# Patient Record
Sex: Male | Born: 1960 | Race: White | Hispanic: No | Marital: Married
Health system: Midwestern US, Academic
[De-identification: ages and names within clinical notes are randomized; demographics above are authoritative.]

---

## 2020-06-21 ENCOUNTER — Encounter: Admit: 2020-06-21 | Discharge: 2020-06-21 | Payer: BC Managed Care – PPO

## 2020-06-24 ENCOUNTER — Encounter: Admit: 2020-06-24 | Discharge: 2020-06-24 | Payer: BC Managed Care – PPO

## 2020-06-24 ENCOUNTER — Ambulatory Visit: Admit: 2020-06-24 | Discharge: 2020-06-24 | Payer: BC Managed Care – PPO

## 2020-06-24 ENCOUNTER — Ambulatory Visit: Admit: 2020-06-24 | Discharge: 2020-06-25 | Payer: BC Managed Care – PPO

## 2020-06-24 DIAGNOSIS — Z20822 Encounter for screening laboratory testing for COVID-19 virus in asymptomatic patient: Secondary | ICD-10-CM

## 2020-06-24 DIAGNOSIS — N200 Calculus of kidney: Secondary | ICD-10-CM

## 2020-06-24 DIAGNOSIS — A6920 Lyme disease, unspecified: Secondary | ICD-10-CM

## 2020-06-24 DIAGNOSIS — F419 Anxiety disorder, unspecified: Secondary | ICD-10-CM

## 2020-06-24 LAB — BASIC METABOLIC PANEL
Lab: 1.1 mg/dL (ref 0.4–1.24)
Lab: 10 g/dL (ref 3–12)
Lab: 106 MMOL/L (ref 98–110)
Lab: 14 mg/dL (ref 7–25)
Lab: 140 MMOL/L (ref 137–147)
Lab: 24 MMOL/L (ref 21–30)
Lab: 4.2 MMOL/L (ref 3.5–5.1)
Lab: 84 mg/dL (ref 70–100)
Lab: 9.2 mg/dL (ref 8.5–10.6)
Lab: >60 mL/min (ref >60)
Lab: >60 mL/min (ref >60)

## 2020-06-24 LAB — CBC
Lab: 4.6 M/UL (ref 4.4–5.5)
Lab: 9.4 10*3/uL (ref 4.5–11.0)

## 2020-06-24 NOTE — Patient Instructions
Urology: [facility]   Pre-Operative Instructions    Surgical Procedure:  Left Ureteroscopy  Date of Surgery:  07/30/20   Arrival Time at the Admission Office (Main Lobby):  TBD    To ensure that your surgery can proceed without delay, you will be contacted by a phone triage nurse from the Preoperative Assessment [facility]) to complete this process.  Please review the information given to you by your surgeon.    You will be called by the surgery staff with your day of surgery arrival time between 2:30 - 4:30 PM the business day prior to surgery. If you have not heard from them after 4:30 PM, please call [phone] to confirm your arrival time.    Pre-Operative Assessment and Instructions:    Once you speak to the nurse or are seen in the Pre-Operative Assessment Clinic, you will be given medication instructions. However, if surgery is within 2 weeks, please read and follow the medication instructions below to prepare for surgery before you speak with the phone triage nurse:    DO NOT STOP your blood thinner until you have contacted your prescribing provider or have been specifically instructed to do so.    Starting Now:  ? Contact your provider who prescribes any of the following to develop a plan for surgery:  o Blood thinners such as aspirin, Aggrenox, Brilinta, Effient, Eliquis, enoxaparin (Lovenox), clopidogrel (Plavix), cilostazol, pentoxifylline (Trental), Pradaxa, Savaysa, ticlopidine, Xarelto, and warfarin (Coumadin)  o Immunosuppresants such as methotrexate, azathioprine, sulfasalazine, everolimus, sirolimus, Humira, Remicade, Enbrel, Simponi, Orencia, Cimzia, Actemra, and Xeljanz  o Chemotherapy    14 days prior to surgery:  ? Stop most vitamins, herbals, and supplements including (but not limited to):  o Alpha lipoic acid, black cohosh, CoQ10, echinacea, eye vitamins, fish oil, flaxseed oil, garlic, gingko biloba, ginseng, glucosamine/chondroitin, kava, Lovaza, lutein, lysine, multivitamin, red yeast rice, SAM-e, saw palmetto, St. John?s wort, turmeric, valerian root, Vascepa, Vitamin A, Vitamin B complex, Vitamin C, Vitamin E  - You DO NOT need to stop: iron, magnesium, potassium    7 days prior to surgery:  ? Stop anti-inflammatory medications such as ibuprofen (Advil, Motrin), naproxen (Aleve), Alka-Seltzer, Excedrin, Midol, celecoxib (Celebrex), diclofenac (Voltaren), diflunisal, etodolac, flurbiprofen, indomethacin, ketoprofen, ketorolac, meloxicam, nabumetone, and piroxicam    ========================================================================    Do not drink alcohol within 24 hours of surgery.    Please do not eat or drink anything after midnight.  No gum, mints, hard candy, snacks, coffee, etc or chewing tobacco allowed after midnight before surgery.  You may brush your teeth but be sure to rinse and spit.    Please shower with an over the counter antibacterial soap the evening before or the morning of surgery.    If your surgery is scheduled as an outpatient, you must arrange to have someone drive you home and have someone with you 24 hours after anesthesia.    If you stay in the hospital after surgery, you will be evaluated daily by your care team for appropriateness for discharge. You will be responsible for coordinating your transportation and home support for your recovery. If you do not know the needs for post-surgical support, please address this prior to surgery with your surgeon or your surgeon's nurse so you will have a safe transition home.     ========================================================================    If you have any questions, please contact your provider's office at [phone].    For emergencies during evenings, nights, weekends, and holidays, contact [facility] operator and request they contact the on-call Urology Resident at [phone].    =========================================================================    PLEASE STOP BY OUTPATIENT LAB FOR PREOPERATIVE LABS TODAY

## 2020-06-24 NOTE — Assessment & Plan Note
It was a pleasure seeing Nathaniel May in clinic today for evaluation of a LEFT 4mm UVJ stone. These findings were discussed in detail with the patient along with treatment options including observation, ESWL, Ureteroscopy, and PCNL. Risks and benefits of each described in detail. Based on today's visit, we will plan for cystoscopy, LEFT URS. Additionally, given family history, described gross hematuria, and renal cysts incompletely evaluated on non-con CT will obtain a CT urogram. This study will also allow [place] to assess for stone passage.     If patient passes stone in the interim and elects to not undergo L URS for nonobstructive stone, he will require a in office cystoscopy for completion of hematuria workup     - Continue flomax  - Plan for cystoscopy, L URS/LL on 07/30/20  - BMP, CBC, urine culture today   - CT urogram for hematuria/cyst workup 07/28/20  - Recommend dietary modifications  - Given strict instructions on when to return to ED if signs of infection such as fever and chills for emergency stent placement if needed    Jane Canary, MD   Urology Resident

## 2020-06-25 ENCOUNTER — Ambulatory Visit: Admit: 2020-06-25 | Discharge: 2020-06-25 | Payer: BC Managed Care – PPO

## 2020-06-25 DIAGNOSIS — N200 Calculus of kidney: Secondary | ICD-10-CM

## 2020-06-25 DIAGNOSIS — R310 Gross hematuria: Secondary | ICD-10-CM

## 2020-06-29 ENCOUNTER — Encounter: Admit: 2020-06-29 | Discharge: 2020-06-29 | Payer: BC Managed Care – PPO

## 2020-06-29 NOTE — Telephone Encounter
Per Dr. Encarnacion Chu note, pt needs CTU on 12/7.  Appt scheduled, called and left VM for pt with date/time/location of CT appt.

## 2020-07-01 ENCOUNTER — Encounter: Admit: 2020-07-01 | Discharge: 2020-07-01 | Payer: BC Managed Care – PPO

## 2020-07-01 MED ORDER — LEVOFLOXACIN 500 MG PO TAB
500 mg | ORAL_TABLET | Freq: Every day | ORAL | 0 refills | 7.00000 days | Status: AC
Start: 2020-07-01 — End: ?

## 2020-07-01 NOTE — Telephone Encounter
Called patient to let him know that his urine culture results came back positive. Let patient know I have sent 5 days of levaquin to his pharmacy to take now and another 5 day course for him to take starting 07/25/20. Patient verbalized understanding.

## 2020-07-02 ENCOUNTER — Encounter: Admit: 2020-07-02 | Discharge: 2020-07-02 | Payer: BC Managed Care – PPO

## 2020-07-02 NOTE — Telephone Encounter
Received return call from patient requesting return call from Dr. Encarnacion Chu nurse with additional questions regarding antibiotic ordered yesterday. Returned call and spoke to patient and wife. Patient inquired if infection was a UTI or in his blood stream. Advised patient his urine culture grew out a bacteria that needs treated prior to surgery per Dr. Luther Redo. Advised patient to take first prescription of Levaquin 500mg  daily for 5 days then additional Levaquin 500mg  daily starting on 07/25/20. Patient stated he is having some back pain. Advised it is possibly related to current infection. Advised patient to call back after completed first round of infection if he is not feeling better to check in with Dr. team and plan on taking second antibiotic as directed 07/25/20. Patient and wife verbalized understanding and voiced appreciation for call. No further questions.

## 2020-07-28 ENCOUNTER — Encounter: Admit: 2020-07-28 | Discharge: 2020-07-28 | Payer: BC Managed Care – PPO

## 2020-07-28 ENCOUNTER — Ambulatory Visit: Admit: 2020-07-28 | Discharge: 2020-07-28 | Payer: BC Managed Care – PPO

## 2020-07-28 DIAGNOSIS — R310 Gross hematuria: Secondary | ICD-10-CM

## 2020-07-28 MED ORDER — IOHEXOL 350 MG IODINE/ML IV SOLN
100 mL | Freq: Once | INTRAVENOUS | 0 refills | Status: CP
Start: 2020-07-28 — End: ?
  Administered 2020-07-28: 21:00:00 100 mL via INTRAVENOUS

## 2020-07-28 MED ORDER — SODIUM CHLORIDE 0.9 % IJ SOLN
50 mL | Freq: Once | INTRAVENOUS | 0 refills | Status: CP
Start: 2020-07-28 — End: ?
  Administered 2020-07-28: 21:00:00 50 mL via INTRAVENOUS

## 2020-07-29 ENCOUNTER — Encounter: Admit: 2020-07-29 | Discharge: 2020-07-29 | Payer: BC Managed Care – PPO

## 2020-07-29 NOTE — Telephone Encounter
Per Dr. Luther Redo, he called and spoke to pt regarding CT results from August 12, 2023.  Pt has passed 10mm stone, surgery tomorrow is not indicated at this time and will be cancelled.  Pt will f/u with Dellia Beckwith, NP in 9 months with renal U/S prior.

## 2020-07-30 ENCOUNTER — Encounter: Admit: 2020-07-30 | Discharge: 2020-07-30 | Payer: BC Managed Care – PPO

## 2020-07-30 DIAGNOSIS — N200 Calculus of kidney: Secondary | ICD-10-CM

## 2020-07-30 NOTE — Telephone Encounter
Pt calling about renal cysts noted on 12/07 CT scan. Reviewed they are noted to be stable and un-chanced from previous imaging. No further questions.

## 2021-09-20 ENCOUNTER — Encounter: Admit: 2021-09-20 | Discharge: 2021-09-20 | Payer: BC Managed Care – PPO

## 2021-09-24 ENCOUNTER — Encounter: Admit: 2021-09-24 | Discharge: 2021-09-24 | Payer: BC Managed Care – PPO

## 2021-09-24 NOTE — Telephone Encounter
09/24/21 - STAT record request faxed to the following per [facility] task request:    Please request additional records from PCP, [facility] - Ph: [phone]; Fax: [phone]. No previous cardiac care.    JMR

## 2021-10-07 ENCOUNTER — Encounter: Admit: 2021-10-07 | Discharge: 2021-10-07 | Payer: BC Managed Care – PPO

## 2021-10-07 ENCOUNTER — Ambulatory Visit: Admit: 2021-10-07 | Discharge: 2021-10-08 | Payer: BC Managed Care – PPO

## 2021-10-07 DIAGNOSIS — E785 Hyperlipidemia, unspecified: Secondary | ICD-10-CM

## 2021-10-07 DIAGNOSIS — A6920 Lyme disease, unspecified: Secondary | ICD-10-CM

## 2021-10-07 DIAGNOSIS — K7581 Nonalcoholic steatohepatitis (NASH): Secondary | ICD-10-CM

## 2021-10-07 DIAGNOSIS — Z9189 Other specified personal risk factors, not elsewhere classified: Secondary | ICD-10-CM

## 2021-10-07 DIAGNOSIS — F419 Anxiety disorder, unspecified: Secondary | ICD-10-CM

## 2021-10-07 DIAGNOSIS — G4730 Sleep apnea, unspecified: Secondary | ICD-10-CM

## 2021-10-07 DIAGNOSIS — F5104 Psychophysiologic insomnia: Secondary | ICD-10-CM

## 2021-10-07 DIAGNOSIS — G4733 Obstructive sleep apnea (adult) (pediatric): Secondary | ICD-10-CM

## 2021-10-07 NOTE — Progress Notes
Date of Service: 10/07/2021    Nathaniel May is a 61 y.o. male.       HPI     Patient is a 61 year old Caucasian male past medical history of obstructive sleep apnea on therapy,  Steatohepatitis(multifactorial), hyperlipidemia, history of chronic Lyme disease who struggles with general muscle aches and discomfort as well as chronic diarrhea.  He does not have significant chest pain, palpitation, or activity intolerance.  Reports chronic fatigue and is trying to develop more regular exercise activities to combat the symptoms.  Has been evaluated by his physician assistant over the last couple years including a CAC score that was increased 40-50 from 1 year to the next.  By best risk estimate this and put him at the 50th percentile for other men his age.  His ASCVD risk estimator puts him at about 11.8% over 10-year risk.  This would be intermediate risk.  Reports previous attempts to take statin therapies have been ineffective because of side effects.  Currently started rosuvastatin and has tolerated that fairly well.  Is on WelChol for his chronic diarrhea with improvement.  Also takes chronic antibiotic therapy because of Lyme disease.         Vitals:    10/07/21 1021   BP: 130/72   BP Source: Arm, Left Upper   Pulse: 59   SpO2: 98%   O2 Device: None (Room air)   PainSc: Zero   Weight: 124.9 kg (275 lb 6.4 oz)   Height: 193 cm (6' 4)     Body mass index is 33.52 kg/m?Marland Kitchen     Past Medical History  Patient Active Problem List    Diagnosis Date Noted   ? Hyperlipemia 10/07/2021   ? OSA (obstructive sleep apnea) 10/07/2021   ? NASH (nonalcoholic steatohepatitis) 10/07/2021   ? Chronic insomnia 10/07/2021   ? Elevated coronary artery calcium score 10/07/2021     09/13/21 Coronary Calcium Score:  Total 48, Left Main 20, LAD 28     ? Nephrolithiasis 06/24/2020   ? Gross hematuria 06/24/2020         Review of Systems   Constitutional: Negative.   HENT: Negative.    Eyes: Negative.    Cardiovascular: Positive for chest pain. Respiratory: Negative.    Endocrine: Negative.    Hematologic/Lymphatic: Negative.    Skin: Negative.    Musculoskeletal: Negative.    Gastrointestinal: Negative.    Genitourinary: Negative.    Neurological: Negative.    Psychiatric/Behavioral: Negative.    Allergic/Immunologic: Negative.        Physical Exam  Well-nourished, male that appears his given age in no distress  Pupils are equal react without scleral injection  Neck is supple with normal Cropp stroke and no bruits.  No masses or jugular venous abnormalities  Lungs are clear to auscultation and symmetric findings  Heart S1, S2 that are normal.  No murmurs, clicks, or gallops  Abdomen is soft and nontender  Pulse are 2+, regular, and symmetric at radial as well as pedal locations  No significant peripheral edema or obvious skin abnormalities    Cardiovascular Studies  Reviewed ECG which is normal, as well as findings from CAC score and other blood work.    Cardiovascular Health Factors  Vitals BP Readings from Last 3 Encounters:   10/07/21 130/72   06/24/20 133/79     Wt Readings from Last 3 Encounters:   10/07/21 124.9 kg (275 lb 6.4 oz)   06/24/20 121.6 kg (268 lb)  BMI Readings from Last 3 Encounters:   10/07/21 33.52 kg/m?   06/24/20 31.78 kg/m?      Smoking Social History     Tobacco Use   Smoking Status Never   Smokeless Tobacco Former      Lipid Profile No results found for: CHOL  No results found for: HDL  No results found for: LDL  No results found for: TRIG   Blood Sugar No results found for: HGBA1C  Glucose   Date Value Ref Range Status   06/24/2020 84 70 - 100 MG/DL Final          Problems Addressed Today  Encounter Diagnoses   Name Primary?   ? Candidate for statin therapy due to risk of future cardiovascular event Yes       Assessment and Plan     The ASCVD risk estimator as well as a coronary artery calcium score are about determining risk for cardiovascular events or complications.  A coronary artery calcium score is not used to determine unstable findings, and therefore would not justify a cardiac stress test.  He does not have symptoms that are consistent with unstable cardiovascular disease.  Therefore would not advise stress testing at this time.  Because he is intermediate risk for cardiovascular disease statin therapy is warranted.  Currently doing fine on rosuvastatin 10 mg p.o. daily.  Would continue that therapy.  More and more data has been accumulated with studies that shows there is no benefit of fibrate medications treating atherosclerotic disease.  Therefore he can stop the fenofibrate.  Did discuss better exercise and diet habits that would help him get into better metabolic state.  Goal would be to move his weight to about 250 pounds.  Patient reports understanding.  No further testing at this time.  Routine follow-up can be if needed.         Current Medications (including today's revisions)  ? azithromycin (ZITHROMAX) 500 mg tablet Take one tablet by mouth daily.   ? citalopram (CELEXA) 20 mg tablet    ? colesevelam (WELCHOL) 625 mg tablet Take have a tab daily   ? eszopiclone (LUNESTA) 2 mg tablet    ? fenofibrate nanocrystallized (TRICOR) 145 mg tablet Take one tablet by mouth daily.   ? rosuvastatin (CRESTOR) 10 mg tablet    ? zolpidem (AMBIEN) 10 mg tablet Take one tablet by mouth at bedtime as needed for Sleep.

## 2023-08-02 IMAGING — CT CALCIUM_SCORING(Adult)
2 series · 16 of 20 positions shown, 18 images · non-contrast
Comparison: none

[Series 2: calcium scoring 3.00 qr36 bestdiast 65% · axial · 0.44mm/px · z∈[+1641,+1777]mm · 8 of 119 slices shown, 10 images]
[im 14/119  vessel]
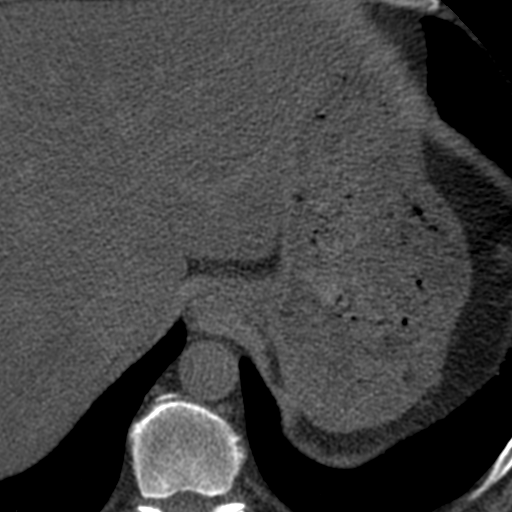
[im 14/119  lung]
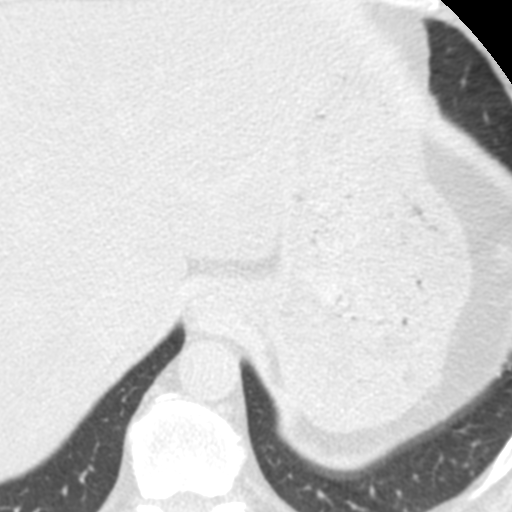
[im 27/119  vessel]
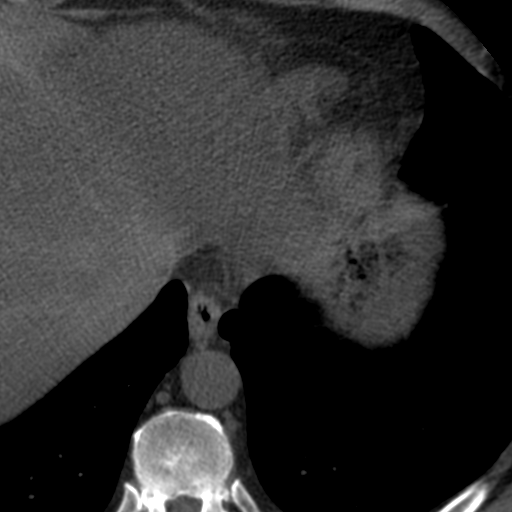
[im 40/119  vessel]
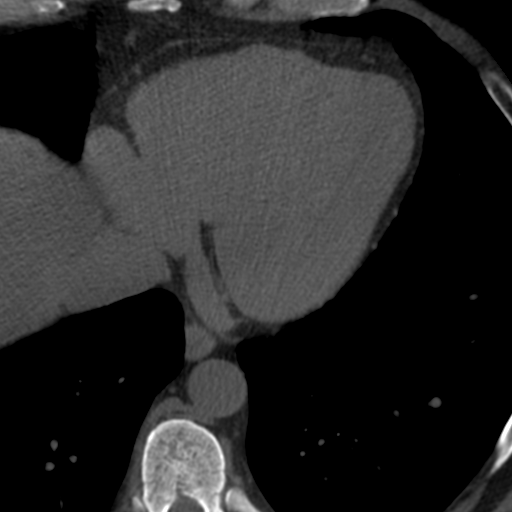
[im 53/119  vessel]
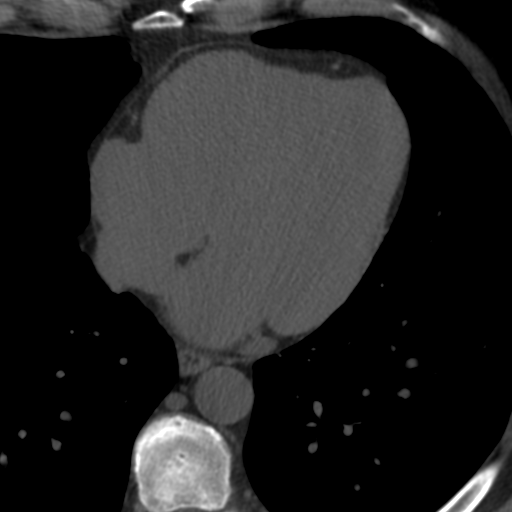
[im 66/119  vessel]
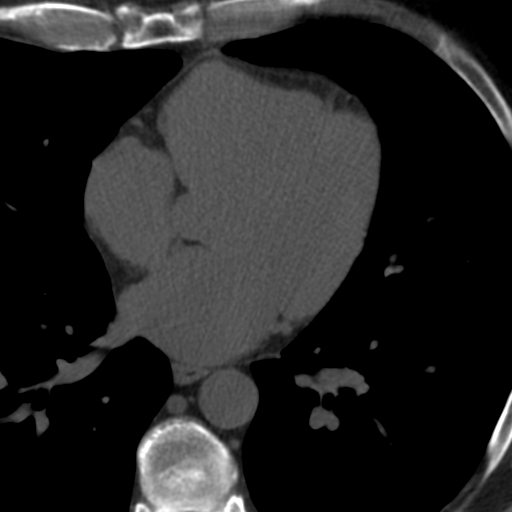
[im 66/119  lung]
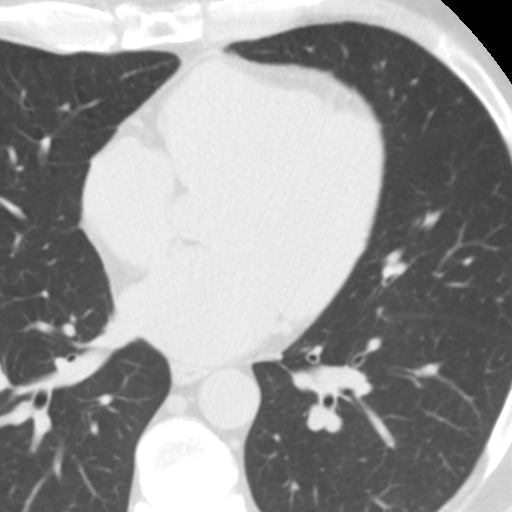
[im 79/119  vessel]
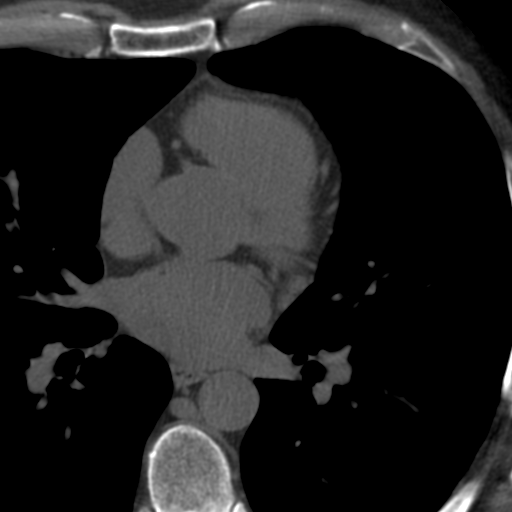
[im 92/119  vessel]
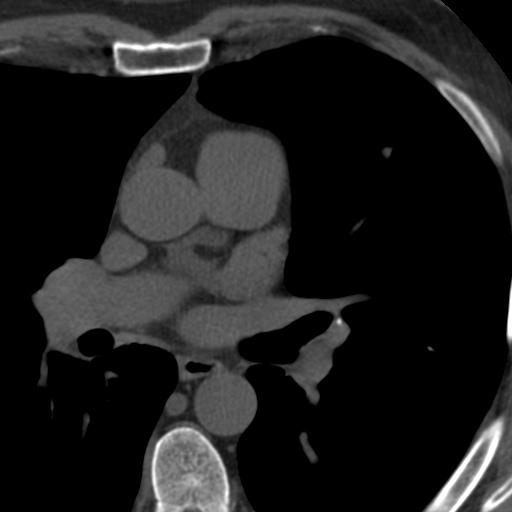
[im 105/119  vessel]
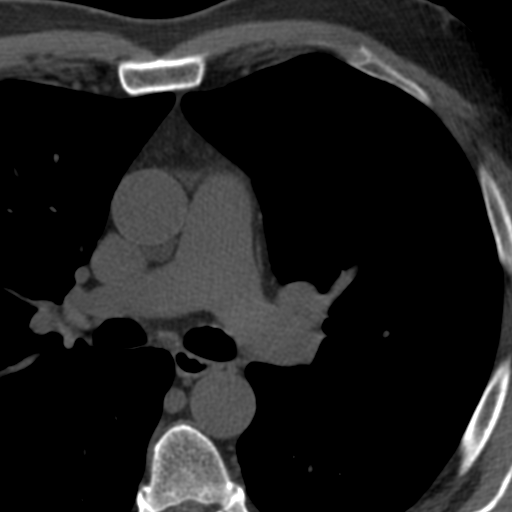

[Series 3: calcium scoring 1.50 br60 bestdiast 65% lung · axial · 0.44mm/px · z∈[+1640,+1778]mm · 8 of 120 slices shown]
[im 14/120  lung]
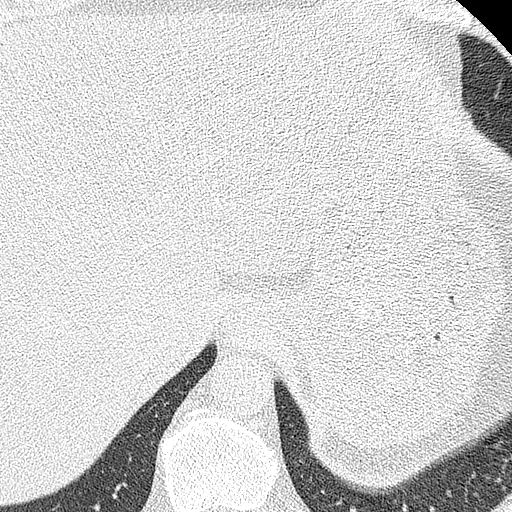
[im 27/120  lung]
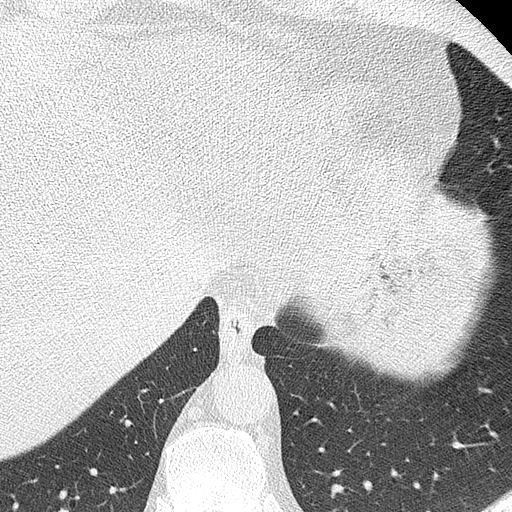
[im 40/120  lung]
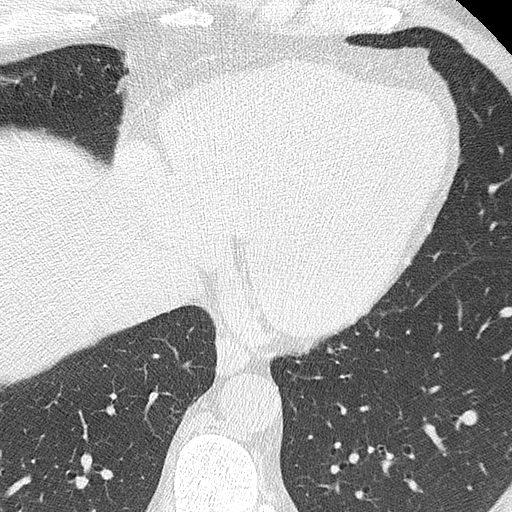
[im 53/120  lung]
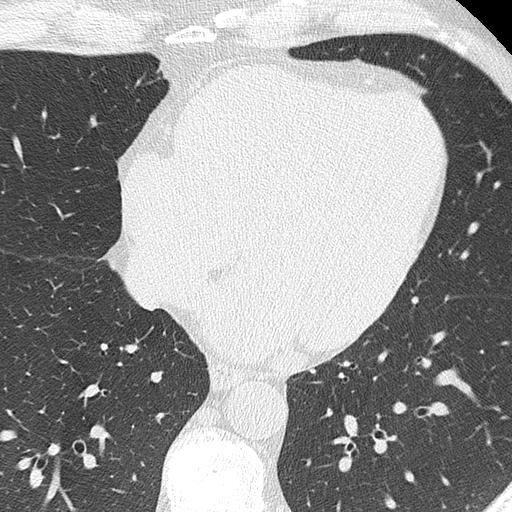
[im 67/120  lung]
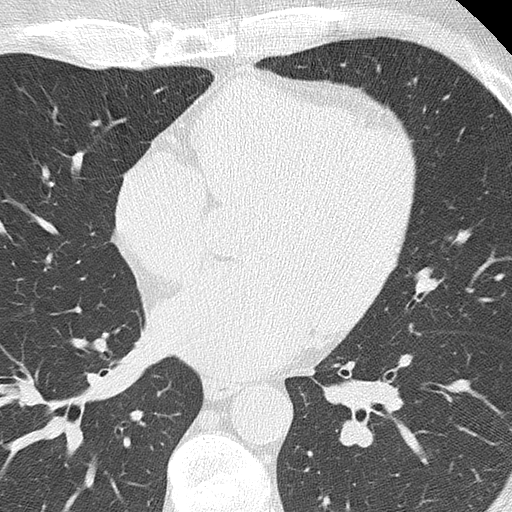
[im 80/120  lung]
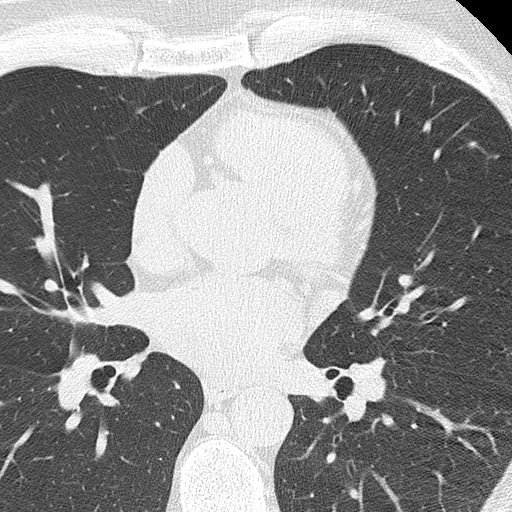
[im 93/120  lung]
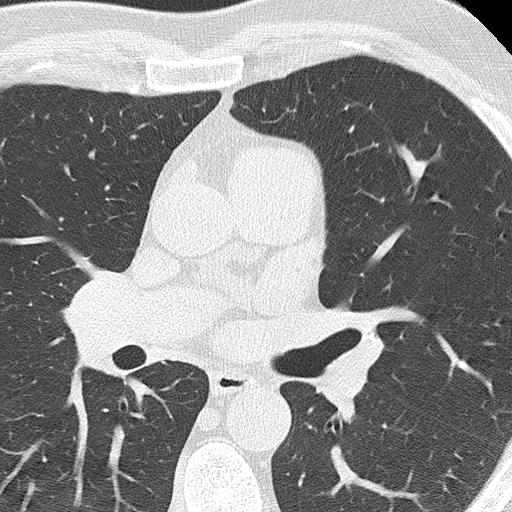
[im 106/120  lung]
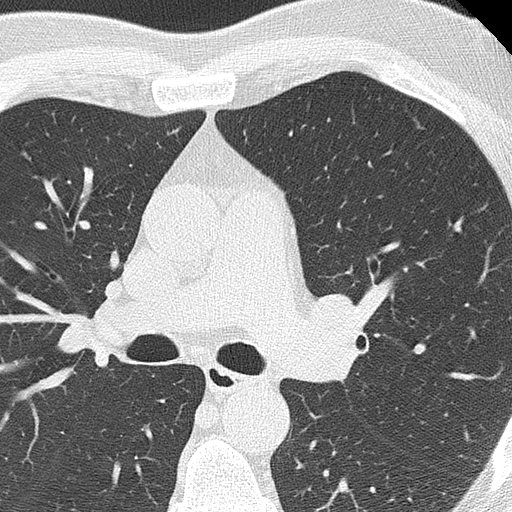

[16 of 20 positions shown; findings below may reference images not displayed]

DIAGNOSTIC STUDIES

EXAM

CT cardiac calcium scoring:

INDICATION

Screening.

TECHNIQUE

Contiguous axial tomographic images were obtained.

All CT scans at this facility use dose modulation, iterative reconstruction, and/or weight based
dosing when appropriate to reduce radiation dose to as low as reasonably achievable.

O CT and nuclear scans in the last year.

COMPARISONS

02/01/2021

FINDINGS

The calcium score is 48, which places the patient in the 50th percentile. The distribution is as
follows: Left main 20, LAD 28 the heart appears normal in size. No pericardial effusion. No aortic
aneurysm. Calcified left hilar nodes. Moderate hepatic steatosis.

IMPRESSION

No significant change compared to the prior study.

1. Moderate cardiovascular disease risk.

Tech Notes:

Cardiac scoring x 1 year ago, hx HTN. CT/NM 1/0 TJ/AC

## 2023-11-24 ENCOUNTER — Encounter: Admit: 2023-11-24 | Discharge: 2023-11-24

## 2024-01-01 ENCOUNTER — Encounter: Admit: 2024-01-01 | Discharge: 2024-01-01 | Payer: BLUE CROSS/BLUE SHIELD

## 2024-01-01 DIAGNOSIS — M79672 Pain in left foot: Secondary | ICD-10-CM

## 2024-01-01 DIAGNOSIS — M25572 Pain in left ankle and joints of left foot: Secondary | ICD-10-CM

## 2024-01-04 ENCOUNTER — Ambulatory Visit: Admit: 2024-01-04 | Discharge: 2024-01-04 | Payer: BLUE CROSS/BLUE SHIELD

## 2024-01-04 ENCOUNTER — Encounter: Admit: 2024-01-04 | Discharge: 2024-01-04 | Payer: BLUE CROSS/BLUE SHIELD

## 2024-01-04 DIAGNOSIS — M205X2 Other deformities of toe(s) (acquired), left foot: Secondary | ICD-10-CM

## 2024-01-04 NOTE — Progress Notes
 Date of Service: 01/04/2024        History of Present Illness     The patient is a 63 year old who presents with left third toe pain and deformity.    He has experienced pain and deformity in the left third toe for six to seven years, with symptoms progressively worsening. The pain is described as numbness with a palpable mass underneath the toe, resembling the sensation of standing on a marble. The pain worsens at night, especially after working late, and he finds it difficult to wear boots due to discomfort. The toe is stiff and continues to deviate laterally, causing significant discomfort. He also complains of dorsal pain at the DIP joint where the toe sticks up and rubs against his shoe.    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 He is concerned about the impact of his condition on his ability to perform his job, particularly tasks involving physical activity such as going up and down ditches. No issues with the second toe, and there is no recent injury to the affected toe.            Past Medical History:    Anxiety disorder    Chronic insomnia    Hyperlipemia    Hyperlipidemia    Lyme disease    NASH (nonalcoholic steatohepatitis)    OSA (obstructive sleep apnea)    Sleep apnea with use of continuous positive airway pressure (CPAP)       Surgical History:   Procedure Laterality Date    GALLBLADDER SURGERY      KNEE SURGERY  1981    orthoscopic in highschool    SPLEEN REPAIR         Family History   Problem Relation Name Age of Onset    Bladder Cancer Father      Brain Tumor Maternal Grandmother      Cancer Father jim         reports that he has quit smoking. He quit smokeless tobacco use about 9 years ago.  His smokeless tobacco use included chew. He reports current alcohol use of about 5.0 standard drinks of alcohol per week. He reports that he does not use drugs.    Allergies   Allergen Reactions    Naproxen RASH          Objective: azithromycin (ZITHROMAX) 500 mg tablet Take one tablet by mouth daily.    citalopram (CELEXA) 20 mg tablet     colesevelam (WELCHOL) 625 mg tablet Take have a tab daily    eszopiclone (LUNESTA) 2 mg tablet     fenofibrate nanocrystallized (TRICOR) 145 mg tablet Take one tablet by mouth daily.    rosuvastatin (CRESTOR) 10 mg tablet     zolpidem (AMBIEN) 10 mg tablet Take one tablet by mouth at bedtime as needed for Sleep.     Vitals:    01/04/24 1050   PainSc: Ten     There is no height or weight on file to calculate BMI.     Physical Exam      RANGE OF MOTION:     Bilateral lesser toe deformities, most significant in the left third toe where there is MTP hyperextension with instability and subluxation, valgus deformity, dorsal DIP erythema without ulcer, the DIP and PIP joints are relatively flexible, there is plantar pain and prominence at the left 3rd toe MTP joint at the metatarsal head.  Imaging      X-rays of the left foot were obtained in clinic and independently interpreted today demonstrating a hyperextension deformity of the third toe MTP joint.  The remainder of the lesser toes have mild flexion deformities.              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  Conservative treatment such as modified shoewear and toe spacers have provided little to no relief.  We discussed that his pathology is stemming from MTP instability causing a hyperextension and valgus deformity.  Patient would like to proceed with a surgical intervention to relieve his symptoms.  This would involve a left third metatarsal head excision with RTS implant, MTP capsulorrhaphy (correction of angular toe deformity), and extensor tendon lengthening.  We discussed the risk and benefits of surgery including convalescence and the patient voiced understanding.  All questions were answered and the patient elected to proceed with the proposed surgical intervention.  Will schedule him at a mutually convenient time as an outpatient.    Lindle Rhea             30 minute total encounter time to include record review, history, examination, discussion of my findings, recommendations, and documentation.

## 2024-02-02 ENCOUNTER — Ambulatory Visit: Admit: 2024-02-02 | Discharge: 2024-02-02 | Payer: BLUE CROSS/BLUE SHIELD

## 2024-02-02 ENCOUNTER — Encounter: Admit: 2024-02-02 | Discharge: 2024-02-02 | Payer: BLUE CROSS/BLUE SHIELD

## 2024-04-24 ENCOUNTER — Encounter: Admit: 2024-04-24 | Discharge: 2024-04-24 | Payer: BLUE CROSS/BLUE SHIELD

## 2024-05-27 ENCOUNTER — Encounter: Admit: 2024-05-27 | Discharge: 2024-05-27 | Payer: BLUE CROSS/BLUE SHIELD

## 2024-05-31 ENCOUNTER — Encounter: Admit: 2024-05-31 | Discharge: 2024-05-31 | Payer: BLUE CROSS/BLUE SHIELD

## 2024-06-04 ENCOUNTER — Encounter: Admit: 2024-06-04 | Discharge: 2024-06-04 | Payer: BLUE CROSS/BLUE SHIELD

## 2024-06-04 ENCOUNTER — Ambulatory Visit: Admit: 2024-06-04 | Discharge: 2024-06-04 | Payer: BLUE CROSS/BLUE SHIELD

## 2024-06-05 ENCOUNTER — Encounter: Admit: 2024-06-05 | Discharge: 2024-06-05 | Payer: BLUE CROSS/BLUE SHIELD

## 2024-06-05 ENCOUNTER — Ambulatory Visit: Admit: 2024-06-05 | Discharge: 2024-06-05 | Payer: BLUE CROSS/BLUE SHIELD

## 2024-06-05 MED ORDER — MIDAZOLAM 1 MG/ML IJ SOLN
INTRAVENOUS | 0 refills | Status: DC
Start: 2024-06-05 — End: 2024-06-05

## 2024-06-05 MED ORDER — ACETAMINOPHEN 1,000 MG/100 ML (10 MG/ML) IV SOLN
INTRAVENOUS | 0 refills | Status: DC
Start: 2024-06-05 — End: 2024-06-05

## 2024-06-05 MED ORDER — PROPOFOL 10 MG/ML IV EMUL 100 ML (INFUSION)(AM)(OR)
INTRAVENOUS | 0 refills | Status: DC
Start: 2024-06-05 — End: 2024-06-05

## 2024-06-05 MED ORDER — LIDOCAINE (PF) 10 MG/ML (1 %) IJ SOLN
Freq: Once | SUBCUTANEOUS | 0 refills | Status: CP | PRN
Start: 2024-06-05 — End: ?

## 2024-06-05 MED ORDER — ARTIFICIAL TEARS (PF) SINGLE DOSE DROPS GROUP
OPHTHALMIC | 0 refills | Status: DC
Start: 2024-06-05 — End: 2024-06-05

## 2024-06-05 MED ORDER — ONDANSETRON HCL (PF) 4 MG/2 ML IJ SOLN
INTRAVENOUS | 0 refills | Status: DC
Start: 2024-06-05 — End: 2024-06-05

## 2024-06-05 MED ORDER — FENTANYL CITRATE (PF) 50 MCG/ML IJ SOLN
INTRAVENOUS | 0 refills | Status: DC
Start: 2024-06-05 — End: 2024-06-05

## 2024-06-05 MED ORDER — DEXAMETHASONE SODIUM PHOSPHATE 4 MG/ML IJ SOLN
INTRAVENOUS | 0 refills | Status: DC
Start: 2024-06-05 — End: 2024-06-05

## 2024-06-05 MED ORDER — CEFAZOLIN 1 GRAM IJ SOLR
INTRAVENOUS | 0 refills | Status: DC
Start: 2024-06-05 — End: 2024-06-05

## 2024-06-05 MED ORDER — PROPOFOL INJ 10 MG/ML IV VIAL
INTRAVENOUS | 0 refills | Status: DC
Start: 2024-06-05 — End: 2024-06-05

## 2024-06-05 MED ORDER — LIDOCAINE (PF) 200 MG/10 ML (2 %) IJ SYRG
INTRAVENOUS | 0 refills | Status: DC
Start: 2024-06-05 — End: 2024-06-05

## 2024-06-05 MED ORDER — ROPIVACAINE (PF) 5 MG/ML (0.5 %) IJ SOLN
Freq: Once | 0 refills | Status: CP | PRN
Start: 2024-06-05 — End: ?

## 2024-06-06 ENCOUNTER — Encounter: Admit: 2024-06-06 | Discharge: 2024-06-06 | Payer: BLUE CROSS/BLUE SHIELD

## 2024-06-12 ENCOUNTER — Encounter: Admit: 2024-06-12 | Discharge: 2024-06-12 | Payer: BLUE CROSS/BLUE SHIELD

## 2024-06-13 ENCOUNTER — Encounter: Admit: 2024-06-13 | Discharge: 2024-06-13 | Payer: BLUE CROSS/BLUE SHIELD

## 2024-06-13 ENCOUNTER — Ambulatory Visit: Admit: 2024-06-13 | Discharge: 2024-06-14 | Payer: BLUE CROSS/BLUE SHIELD

## 2024-06-24 ENCOUNTER — Encounter: Admit: 2024-06-24 | Discharge: 2024-06-24 | Payer: BLUE CROSS/BLUE SHIELD

## 2024-06-24 DIAGNOSIS — Z9889 Other specified postprocedural states: Principal | ICD-10-CM

## 2024-06-27 ENCOUNTER — Ambulatory Visit: Admit: 2024-06-27 | Discharge: 2024-06-28 | Payer: BLUE CROSS/BLUE SHIELD

## 2024-06-27 ENCOUNTER — Ambulatory Visit: Admit: 2024-06-27 | Discharge: 2024-06-27 | Payer: BLUE CROSS/BLUE SHIELD

## 2024-06-27 ENCOUNTER — Encounter: Admit: 2024-06-27 | Discharge: 2024-06-27 | Payer: BLUE CROSS/BLUE SHIELD

## 2024-06-27 DIAGNOSIS — Z09 Encounter for follow-up examination after completed treatment for conditions other than malignant neoplasm: Principal | ICD-10-CM

## 2024-06-27 NOTE — Progress Notes [1]
 His wounds look fine.  He still has bit of prominence and discomfort plantarly but I really think this should improve with removal of the metatarsal head.  His toe is resting in a satisfactory position.  I have counseled him on his activity level and expectations.  He can transition into a shoe as he can tolerate.  I will see him back on an as-needed basis.

## 2024-08-25 ENCOUNTER — Encounter: Admit: 2024-08-25 | Discharge: 2024-08-25 | Payer: BLUE CROSS/BLUE SHIELD

## 2024-08-25 ENCOUNTER — Inpatient Hospital Stay: Admit: 2024-08-25 | Discharge: 2024-08-25 | Payer: BLUE CROSS/BLUE SHIELD

## 2024-08-25 ENCOUNTER — Inpatient Hospital Stay
Admission: EM | Admit: 2024-08-25 | Payer: BLUE CROSS/BLUE SHIELD | Attending: Trauma Surgery | Admitting: Trauma Surgery

## 2024-08-25 ENCOUNTER — Emergency Department: Admit: 2024-08-25 | Discharge: 2024-08-25 | Payer: BLUE CROSS/BLUE SHIELD | Attending: Trauma Surgery

## 2024-08-25 MED ORDER — PROPOFOL 10 MG/ML IV EMUL 100 ML (INFUSION)(AM)(OR)
INTRAVENOUS | 0 refills | Status: DC
Start: 2024-08-25 — End: 2024-08-25

## 2024-08-25 MED ORDER — ALBUTEROL SULFATE 90 MCG/ACTUATION IN HFAA
RESPIRATORY_TRACT | 0 refills | Status: DC
Start: 2024-08-25 — End: 2024-08-25

## 2024-08-25 MED ORDER — FENTANYL CITRATE (PF) 50 MCG/ML IJ SOLN
INTRAVENOUS | 0 refills | Status: DC
Start: 2024-08-25 — End: 2024-08-25

## 2024-08-25 MED ORDER — NOREPINEPHRINE IV DRIP D5W STD CONC (AM)(OR)
INTRAVENOUS | 0 refills | Status: DC
Start: 2024-08-25 — End: 2024-08-25

## 2024-08-25 MED ORDER — ARTIFICIAL TEARS (PF) SINGLE DOSE DROPS GROUP
OPHTHALMIC | 0 refills | Status: DC
Start: 2024-08-25 — End: 2024-08-25

## 2024-08-25 MED ORDER — ROCURONIUM 10 MG/ML IV SOLN
INTRAVENOUS | 0 refills | Status: DC
Start: 2024-08-25 — End: 2024-08-25

## 2024-08-25 MED ORDER — INSULIN REGULAR HUMAN(#) 1 UNIT/ML IJ SYRINGE
SUBCUTANEOUS | 0 refills | Status: DC
Start: 2024-08-25 — End: 2024-08-25

## 2024-08-25 MED ORDER — DEXTROSE 50 % IN WATER (D50W) IV SYRG
INTRAVENOUS | 0 refills | Status: DC
Start: 2024-08-25 — End: 2024-08-25

## 2024-08-25 MED ORDER — ONDANSETRON HCL (PF) 4 MG/2 ML IJ SOLN
INTRAVENOUS | 0 refills | Status: DC
Start: 2024-08-25 — End: 2024-08-25

## 2024-08-25 NOTE — Anesthesia Postprocedure Evaluation [25]
 Post-Anesthesia Evaluation    Name: Nathaniel May      MRN: [id number]     DOB: 1961/01/19     Age: 64 y.o.     Sex: male   __________________________________________________________________________     Procedure Information       Anesthesia Start Date/Time: 08/25/24 1406    Procedure: IR PELVIS ARTERIOGRAM DIAGNOSTIC WITH INTERVENTION    Location: Interventional Radiology: [facility]            Post-Anesthesia Vitals  Temp: 37.3 ?C (99.2 ?F) (01/04 1554)  ABP: 117/58 (01/04 1544)  ART BP: 156/70 (01/04 1549)   Vitals Value Taken Time   BP     Temp 37.3 ?C (99.2 ?F) 08/25/24 15:54   Pulse 85 08/25/24 16:06   Respirations 20 PER MINUTE 08/25/24 16:06   SpO2 96 % 08/25/24 16:06   O2 Device     ABP     ART BP 156/70 08/25/24 15:49   Vitals shown include unfiled device data.      Post Anesthesia Evaluation Note    Evaluation location: ICU  Patient participation: patient intubated, unable to assess; expectation of recovery by ICU physician  Level of consciousness: intubated & sedated    Ventilator settings  Mode: VC  FIO2:   Tidal Volume: 500  Vent rate: 19  PEEP: 5    Hydration: normovolemia  Temperature: 36.0?C - 38.4?C    Perioperative Events       Post-op nausea and vomiting: no PONV    Postoperative Status  Cardiovascular status: hemodynamically unstable  Respiratory status: ETT and supplemental oxygen  ICU Information  VasoactiveDrips:norepinephrine  infusion  Blood Products Given-no                      Perioperative Events  There were no known complications for this encounter.

## 2024-08-26 ENCOUNTER — Encounter: Admit: 2024-08-26 | Discharge: 2024-08-26 | Payer: BLUE CROSS/BLUE SHIELD

## 2024-08-26 ENCOUNTER — Inpatient Hospital Stay: Admit: 2024-08-26 | Discharge: 2024-08-26 | Payer: BLUE CROSS/BLUE SHIELD

## 2024-08-26 MED ORDER — SODIUM BICARBONATE 1 MEQ/ML (8.4 %) IV SOLN
INTRAVENOUS | 0 refills | Status: DC
Start: 2024-08-26 — End: 2024-08-27

## 2024-08-26 MED ORDER — PHENYLEPHRINE IN 0.9% NACL (STD CONC) (PREMADE)(AM)(OR)
INTRAVENOUS | 0 refills | Status: DC
Start: 2024-08-26 — End: 2024-08-27

## 2024-08-26 MED ORDER — CALCIUM CHLORIDE 100 MG/ML (10 %) IV SOLN
INTRAVENOUS | 0 refills | Status: DC
Start: 2024-08-26 — End: 2024-08-27

## 2024-08-26 MED ORDER — HYDROMORPHONE (PF) 2 MG/ML IJ SYRG
INTRAVENOUS | 0 refills | Status: DC
Start: 2024-08-26 — End: 2024-08-27

## 2024-08-26 MED ORDER — ROCURONIUM 10 MG/ML IV SOLN
INTRAVENOUS | 0 refills | Status: DC
Start: 2024-08-26 — End: 2024-08-27

## 2024-08-26 MED ORDER — CEFAZOLIN 1 GRAM IJ SOLR
INTRAVENOUS | 0 refills | Status: DC
Start: 2024-08-26 — End: 2024-08-27

## 2024-08-26 MED ORDER — ONDANSETRON HCL (PF) 4 MG/2 ML IJ SOLN
INTRAVENOUS | 0 refills | Status: DC
Start: 2024-08-26 — End: 2024-08-27

## 2024-08-26 MED ORDER — SUGAMMADEX 100 MG/ML IV SOLN
INTRAVENOUS | 0 refills | Status: DC
Start: 2024-08-26 — End: 2024-08-27

## 2024-08-26 MED ORDER — ALBUMIN, HUMAN 5 % 250 ML IV SOLP (AN)(OSM)
INTRAVENOUS | 0 refills | Status: DC
Start: 2024-08-26 — End: 2024-08-27

## 2024-08-26 MED ORDER — LACTATED RINGERS IV SOLP
INTRAVENOUS | 0 refills | Status: DC
Start: 2024-08-26 — End: 2024-08-27

## 2024-08-26 MED ORDER — TRANEXAMIC ACID IN NACL,ISO-OS 1,000 MG/100 ML (10MG/ML) IV PGBK (INFUSION)(AM)(OR)
INTRAVENOUS | 0 refills | Status: DC
Start: 2024-08-26 — End: 2024-08-27

## 2024-08-26 MED ORDER — ARTIFICIAL TEARS (PF) SINGLE DOSE DROPS GROUP
OPHTHALMIC | 0 refills | Status: DC
Start: 2024-08-26 — End: 2024-08-27

## 2024-08-26 MED ORDER — KETAMINE 10 MG/ML IJ SOLN
INTRAVENOUS | 0 refills | Status: DC
Start: 2024-08-26 — End: 2024-08-27

## 2024-08-26 MED ORDER — EPINEPHRINE 0.1MG NS 10ML SYR (AM)(OR)
INTRAVENOUS | 0 refills | Status: DC
Start: 2024-08-26 — End: 2024-08-27

## 2024-08-26 MED ORDER — VASOPRESSIN 20 UNITS/20ML SYR (1 UNIT/ML) (AN) (OSM)
INTRAVENOUS | 0 refills | Status: DC
Start: 2024-08-26 — End: 2024-08-27

## 2024-08-26 MED ORDER — ELECTROLYTE-A IV SOLP
INTRAVENOUS | 0 refills | Status: DC
Start: 2024-08-26 — End: 2024-08-27

## 2024-08-26 MED ORDER — ACETAMINOPHEN 1,000 MG/100 ML (10 MG/ML) IV SOLN
INTRAVENOUS | 0 refills | Status: DC
Start: 2024-08-26 — End: 2024-08-27

## 2024-08-26 MED ORDER — MIDAZOLAM 1 MG/ML IJ SOLN
INTRAVENOUS | 0 refills | Status: DC
Start: 2024-08-26 — End: 2024-08-27

## 2024-08-26 MED ORDER — PROPOFOL 10 MG/ML IV EMUL 100 ML (INFUSION)(AM)(OR)
INTRAVENOUS | 0 refills | Status: DC
Start: 2024-08-26 — End: 2024-08-27
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
## 2024-08-26 NOTE — Anesthesia Procedure Notes [28]
 Anesthesia Procedure: Central Venous Catheter Line    CENTRAL LINE INSERTION    Date/Time: 08/26/2024 6:00 PM    Patient location: OR  Indications: inadequate peripheral IV access and shock    Preprocedure checklist performed: 2 patient identifiers, risks & benefits discussed, patient evaluated, timeout performed, consent obtained, patient being monitored and CVC bundle followed (proper hand washing, maximal sterile barrier technique with cap, sterile gown, sterile glove, sterile drape, and skin prep for antisepsis)        CVC Line Insertion Procedure  Skin prepped with chlorhexidine; skin prep agent completely dried prior to procedure.  Patient Position: Trendelenburg  Location: internal jugular vein  Laterality: right  Vein identification: ultrasound guided  Confirmation of venous placement prior to dilation of vein by: ultrasound  Number of attempts: 1  Successful placement: yes  Patient sedated: yes      Catheter:     Catheter type: double lumen w/ introducer placed using standard wire through needle technique     Catheter size: 9 Fr     Catheter insertion depth (cm): to hub.      Procedure Outcome  Post procedure: all ports aspirated, dressing applied and line sutured; Dressing: chlorhexidine impregnated sponge and sterile occlusive dressing  Placement verification: x-ray verification pending  Events: none  Observations: patient tolerated well      Additional notes: Triple lumen introducer catheter inserted.      Performed by: Irven Quintin CROME, MD  Authorized by: Myer Rexene SQUIBB, DO
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
## 2024-08-27 ENCOUNTER — Encounter: Admit: 2024-08-27 | Discharge: 2024-08-27 | Payer: BLUE CROSS/BLUE SHIELD

## 2024-08-27 ENCOUNTER — Inpatient Hospital Stay: Admit: 2024-08-27 | Discharge: 2024-08-27 | Payer: BLUE CROSS/BLUE SHIELD

## 2024-08-27 DIAGNOSIS — S22009S Unspecified fracture of unspecified thoracic vertebra, sequela: Principal | ICD-10-CM

## 2024-08-28 ENCOUNTER — Encounter: Admit: 2024-08-28 | Discharge: 2024-08-28 | Payer: BLUE CROSS/BLUE SHIELD

## 2024-08-28 ENCOUNTER — Inpatient Hospital Stay: Admit: 2024-08-28 | Discharge: 2024-08-28 | Payer: BLUE CROSS/BLUE SHIELD

## 2024-08-29 ENCOUNTER — Encounter: Admit: 2024-08-29 | Discharge: 2024-08-29 | Payer: BLUE CROSS/BLUE SHIELD

## 2024-08-30 ENCOUNTER — Inpatient Hospital Stay: Admit: 2024-08-30 | Discharge: 2024-08-30 | Payer: BLUE CROSS/BLUE SHIELD

## 2024-08-31 ENCOUNTER — Inpatient Hospital Stay: Admit: 2024-08-31 | Discharge: 2024-08-30 | Payer: BLUE CROSS/BLUE SHIELD

## 2024-08-31 ENCOUNTER — Inpatient Hospital Stay: Admit: 2024-08-31 | Discharge: 2024-08-31 | Payer: BLUE CROSS/BLUE SHIELD

## 2024-09-02 ENCOUNTER — Inpatient Hospital Stay: Admit: 2024-09-02 | Discharge: 2024-09-02 | Payer: BLUE CROSS/BLUE SHIELD

## 2024-09-02 ENCOUNTER — Encounter: Admit: 2024-09-02 | Discharge: 2024-09-02 | Payer: BLUE CROSS/BLUE SHIELD

## 2024-09-03 ENCOUNTER — Inpatient Hospital Stay: Admit: 2024-09-03 | Discharge: 2024-09-03 | Payer: BLUE CROSS/BLUE SHIELD

## 2024-09-03 ENCOUNTER — Encounter: Admit: 2024-09-03 | Discharge: 2024-09-03 | Payer: BLUE CROSS/BLUE SHIELD

## 2024-09-04 ENCOUNTER — Inpatient Hospital Stay: Admit: 2024-09-04 | Discharge: 2024-09-04 | Payer: BLUE CROSS/BLUE SHIELD

## 2024-09-04 ENCOUNTER — Encounter: Admit: 2024-09-04 | Discharge: 2024-09-04 | Payer: BLUE CROSS/BLUE SHIELD

## 2024-09-04 DIAGNOSIS — S32502A Unspecified fracture of left pubis, initial encounter for closed fracture: Principal | ICD-10-CM

## 2024-09-04 DIAGNOSIS — S728X2A Other fracture of left femur, initial encounter for closed fracture: Secondary | ICD-10-CM

## 2024-09-05 ENCOUNTER — Encounter: Admit: 2024-09-05 | Discharge: 2024-09-05 | Payer: BLUE CROSS/BLUE SHIELD

## 2024-09-05 ENCOUNTER — Inpatient Hospital Stay: Admit: 2024-09-05 | Discharge: 2024-09-05 | Payer: BLUE CROSS/BLUE SHIELD

## 2024-09-05 DIAGNOSIS — S32502A Unspecified fracture of left pubis, initial encounter for closed fracture: Principal | ICD-10-CM

## 2024-09-05 NOTE — Progress Notes [1]
 Nathaniel May.received radiation therapy simulation for treatment planning today.

## 2024-09-05 NOTE — Progress Notes [1]
 Nathaniel May Cleverly.received radiation therapy treatment today.  1 of 1 treatments. Single fx hetero hip.

## 2024-09-05 NOTE — Progress Notes [1]
 Simulation Procedure Note    Date: 09/05/2024   Nathaniel May is a 64 y.o. male.     There were no encounter diagnoses.  Anatomical Site: Left hip    On 09/05/2024,  [name] underwent 2-DIMENSIONAL RADIATION THERAPY  simulation procedure on the CT Simulator.      Informed consent has been obtained for the radiation oncology treatment.     The appropriate region was scanned in order to establish treatment fields for radiation therapy and is medically necessary to establish the treatment fields to the left hip.    During the simulation process [name] was setup in the supine position .    Axial CT images were acquired through the region of interest in order to establish the appropriate isocenter.  These images will be transferred to the treatment planning system in order to identify the dose volumes for the targeted treatment area and for delineation of the normal critical structures, including the bladder, rectum, uninvolved soft tissue  which are in close proximity of the targeted area of treatment.                   Bernardino ONEIDA Seat, MD

## 2024-09-06 ENCOUNTER — Inpatient Hospital Stay: Admit: 2024-09-06 | Discharge: 2024-09-06 | Payer: BLUE CROSS/BLUE SHIELD

## 2024-09-07 ENCOUNTER — Inpatient Hospital Stay: Admit: 2024-09-07 | Discharge: 2024-09-07 | Payer: BLUE CROSS/BLUE SHIELD

## 2024-09-09 ENCOUNTER — Inpatient Hospital Stay: Admit: 2024-09-09 | Discharge: 2024-09-09 | Payer: BLUE CROSS/BLUE SHIELD

## 2024-09-09 ENCOUNTER — Encounter: Admit: 2024-09-09 | Discharge: 2024-09-09 | Payer: BLUE CROSS/BLUE SHIELD

## 2024-09-10 ENCOUNTER — Inpatient Hospital Stay: Admit: 2024-09-10 | Discharge: 2024-09-10 | Payer: BLUE CROSS/BLUE SHIELD

## 2024-09-10 ENCOUNTER — Encounter: Admit: 2024-09-10 | Discharge: 2024-09-10 | Payer: BLUE CROSS/BLUE SHIELD

## 2024-09-11 ENCOUNTER — Encounter: Admit: 2024-09-11 | Discharge: 2024-09-11 | Payer: BLUE CROSS/BLUE SHIELD

## 2024-09-11 ENCOUNTER — Inpatient Hospital Stay: Admit: 2024-09-11 | Discharge: 2024-09-11 | Payer: BLUE CROSS/BLUE SHIELD

## 2024-09-12 ENCOUNTER — Inpatient Hospital Stay: Admit: 2024-09-12 | Discharge: 2024-09-12 | Payer: BLUE CROSS/BLUE SHIELD

## 2024-09-12 ENCOUNTER — Encounter: Admit: 2024-09-12 | Discharge: 2024-09-12 | Payer: BLUE CROSS/BLUE SHIELD

## 2024-09-13 ENCOUNTER — Inpatient Hospital Stay: Admit: 2024-09-13 | Discharge: 2024-09-13 | Payer: BLUE CROSS/BLUE SHIELD

## 2024-09-17 ENCOUNTER — Encounter: Admit: 2024-09-17 | Discharge: 2024-09-17 | Payer: BLUE CROSS/BLUE SHIELD

## 2024-09-19 ENCOUNTER — Encounter: Admit: 2024-09-19 | Discharge: 2024-09-19 | Payer: BLUE CROSS/BLUE SHIELD

## 2024-09-19 NOTE — Progress Notes [1]
 CTA Abd/Pel dated 09/09/2024 sent to PCP on file. Report recommends a CT Abd/Pel due in 6 months.

## 2024-09-20 ENCOUNTER — Inpatient Hospital Stay: Admit: 2024-09-20 | Discharge: 2024-09-20 | Payer: BLUE CROSS/BLUE SHIELD

## 2024-09-24 ENCOUNTER — Encounter: Admit: 2024-09-24 | Discharge: 2024-09-24 | Payer: BLUE CROSS/BLUE SHIELD

## 2024-09-24 ENCOUNTER — Inpatient Hospital Stay: Admit: 2024-09-24 | Discharge: 2024-09-24 | Payer: BLUE CROSS/BLUE SHIELD

## 2024-09-25 ENCOUNTER — Inpatient Hospital Stay: Admit: 2024-09-25 | Discharge: 2024-09-25 | Payer: BLUE CROSS/BLUE SHIELD

## 2024-09-26 ENCOUNTER — Encounter: Admit: 2024-09-26 | Discharge: 2024-09-26 | Payer: BLUE CROSS/BLUE SHIELD
